# Patient Record
Sex: Male | Born: 1985 | Race: Black or African American | Hispanic: No | Marital: Single | Smoking: Never smoker
Health system: Southern US, Community
[De-identification: ages and names within clinical notes are randomized; demographics above are authoritative.]

## PROBLEM LIST (undated history)

## (undated) ENCOUNTER — Emergency Department (HOSPITAL_COMMUNITY): Payer: PRIVATE HEALTH INSURANCE

## (undated) DIAGNOSIS — R011 Cardiac murmur, unspecified: Secondary | ICD-10-CM

---

## 2008-04-12 ENCOUNTER — Emergency Department (HOSPITAL_COMMUNITY): Admission: EM | Admit: 2008-04-12 | Discharge: 2008-04-12 | Payer: Self-pay | Admitting: Emergency Medicine

## 2008-05-16 ENCOUNTER — Emergency Department (HOSPITAL_COMMUNITY): Admission: EM | Admit: 2008-05-16 | Discharge: 2008-05-16 | Payer: Self-pay | Admitting: Emergency Medicine

## 2008-05-16 ENCOUNTER — Other Ambulatory Visit: Payer: Self-pay

## 2009-01-17 ENCOUNTER — Emergency Department (HOSPITAL_COMMUNITY): Admission: EM | Admit: 2009-01-17 | Discharge: 2009-01-17 | Payer: Self-pay | Admitting: Emergency Medicine

## 2009-12-03 ENCOUNTER — Emergency Department (HOSPITAL_COMMUNITY): Admission: EM | Admit: 2009-12-03 | Discharge: 2009-12-03 | Payer: Self-pay | Admitting: Emergency Medicine

## 2011-05-20 LAB — COMPREHENSIVE METABOLIC PANEL
ALT: 27
AST: 24
Albumin: 3.8
Alkaline Phosphatase: 69
Chloride: 103
GFR calc Af Amer: >60
Potassium: 3.4 — ABNORMAL LOW
Sodium: 140
Total Bilirubin: 1.2

## 2011-05-20 LAB — POCT CARDIAC MARKERS: Troponin i, poc: <0.05

## 2011-05-20 LAB — DIFFERENTIAL
Basophils Absolute: 0
Basophils Relative: 1
Eosinophils Relative: 1
Lymphocytes Relative: 33
Monocytes Absolute: 0.6

## 2011-05-20 LAB — CBC
Platelets: 222
WBC: 5.9

## 2011-12-09 ENCOUNTER — Observation Stay (HOSPITAL_COMMUNITY)
Admission: EM | Admit: 2011-12-09 | Discharge: 2011-12-10 | Disposition: A | Discharge status: Home / Self Care | Payer: PRIVATE HEALTH INSURANCE | Source: Ambulatory Visit | Attending: Emergency Medicine | Admitting: Emergency Medicine

## 2011-12-09 ENCOUNTER — Encounter (HOSPITAL_COMMUNITY): Payer: Self-pay | Admitting: *Deleted

## 2011-12-09 ENCOUNTER — Other Ambulatory Visit: Payer: Self-pay

## 2011-12-09 ENCOUNTER — Emergency Department (HOSPITAL_COMMUNITY): Payer: PRIVATE HEALTH INSURANCE

## 2011-12-09 DIAGNOSIS — R079 Chest pain, unspecified: Principal | ICD-10-CM | POA: Insufficient documentation

## 2011-12-09 LAB — CBC
MCV: 80.3 fL (ref 78.0–100.0)
Platelets: 204 10*3/uL (ref 150–400)
RDW: 14.8 % (ref 11.5–15.5)
WBC: 5.3 10*3/uL (ref 4.0–10.5)

## 2011-12-09 LAB — CARDIAC PANEL(CRET KIN+CKTOT+MB+TROPI)
Relative Index: 0.8 (ref 0.0–2.5)
Total CK: 262 U/L — ABNORMAL HIGH (ref 7–232)
Troponin I: <0.3 ng/mL (ref <0.30)
Troponin I: <0.3 ng/mL (ref <0.30)

## 2011-12-09 LAB — BASIC METABOLIC PANEL
Chloride: 102 mEq/L (ref 96–112)
Creatinine, Ser: 0.84 mg/dL (ref 0.50–1.35)
GFR calc Af Amer: >90 mL/min (ref >90)
Sodium: 138 mEq/L (ref 135–145)

## 2011-12-09 LAB — TROPONIN I: Troponin I: <0.3 ng/mL (ref <0.30)

## 2011-12-09 MED ORDER — ASPIRIN 81 MG PO CHEW
324.0000 mg | CHEWABLE_TABLET | Freq: Once | ORAL | Status: AC
  Administered 2011-12-09: 324 mg via ORAL
  Filled 2011-12-09: qty 4

## 2011-12-09 NOTE — ED Notes (Signed)
To ED for eval of intermittent left arm and leg weakness noted today while in class. States he also felt nauseated and some cp. Only pain now is left arm.  Skin w/d, resp e/u.

## 2011-12-09 NOTE — ED Provider Notes (Addendum)
History     CSN: [id number]  Arrival date & time 12/09/11  1227   First MD Initiated Contact with Patient 12/09/11 1315      Chief Complaint  Patient presents with  . Chest Pain    (Consider location/radiation/quality/duration/timing/severity/associated sxs/prior treatment) Patient is a 26 y.o. male presenting with chest pain. The history is provided by the patient.  Chest Pain The chest pain began yesterday. Duration of episode(s) is 10 minutes. Chest pain occurs intermittently. The chest pain is unchanged. Associated with: sitting still. At its most intense, the pain is at 6/10. The pain is currently at 0/10. The severity of the pain is mild. The quality of the pain is described as sharp. The pain radiates to the left arm and left jaw. Primary symptoms include nausea. Pertinent negatives for primary symptoms include no fever, no fatigue, no syncope, no shortness of breath, no cough, no wheezing, no palpitations, no abdominal pain, no vomiting, no dizziness and no altered mental status.  Associated symptoms include near-syncope and weakness.  Pertinent negatives for associated symptoms include no claudication, no diaphoresis, no lower extremity edema, no numbness, no orthopnea and no paroxysmal nocturnal dyspnea. He tried nothing for the symptoms. Risk factors include obesity, male gender and sedentary lifestyle (fam hx of MI at 69, sudden death; UNcle adn grandmother died  at 54 yo from MI).  His family medical history is significant for CAD in family, diabetes in family, hyperlipidemia in family, hypertension in family, early MI in family, PE in family and sudden death in family.     History reviewed. No pertinent past medical history.  History reviewed. No pertinent past surgical history.  History reviewed. No pertinent family history.  History  Substance Use Topics  . Smoking status: Never Smoker   . Smokeless tobacco: Not on file  . Alcohol Use: No      Review of Systems    Constitutional: Negative for fever, diaphoresis and fatigue.  HENT: Negative for neck pain and neck stiffness.   Respiratory: Negative for cough, shortness of breath and wheezing.   Cardiovascular: Positive for chest pain and near-syncope. Negative for palpitations, orthopnea, claudication and syncope.  Gastrointestinal: Positive for nausea. Negative for vomiting and abdominal pain.  Neurological: Positive for weakness. Negative for dizziness and numbness.  Psychiatric/Behavioral: Negative for altered mental status.  All other systems reviewed and are negative.    Allergies  Review of patient's allergies indicates no known allergies.  Home Medications  No current outpatient prescriptions on file.  BP 149/81  Pulse 59  Temp(Src) 97.6 F (36.4 C) (Oral)  Resp 19  SpO2 100%  Physical Exam  Nursing note and vitals reviewed. Constitutional: He appears well-developed and well-nourished. No distress.  HENT:  Head: Normocephalic and atraumatic.  Eyes: Conjunctivae and EOM are normal. Pupils are equal, round, and reactive to light.  Neck: Normal range of motion. Neck supple. Normal carotid pulses and no JVD present. Carotid bruit is not present. No rigidity. Normal range of motion present.  Cardiovascular: Normal rate, regular rhythm, S1 normal, S2 normal, normal heart sounds, intact distal pulses and normal pulses.  Exam reveals no gallop and no friction rub.   No murmur heard.      No pitting edema bilaterally, RRR, no aberrant sounds on auscultations, distal pulses intact, no carotid bruit or JVD.   Pulmonary/Chest: Effort normal and breath sounds normal. No accessory muscle usage or stridor. No respiratory distress. He exhibits no tenderness and no bony tenderness.  Abdominal: Bowel  sounds are normal.       Soft non tender. Non pulsatile aorta.   Skin: Skin is warm, dry and intact. No rash noted. He is not diaphoretic. No cyanosis. Nails show no clubbing.    ED Course   Procedures (including critical care time)   Labs Reviewed  CBC  BASIC METABOLIC PANEL  TROPONIN I   Dg Chest 2 View  12/09/2011  *RADIOLOGY REPORT*  Clinical Data: Chest pain and left arm pain.  CHEST - 2 VIEW  Comparison: None.  Findings: Trachea is midline.  Heart size normal.  Lungs are clear. No pleural fluid.  IMPRESSION: No acute findings.  Original Report Authenticated By: Reyes Ivan, M.D.     No diagnosis found.    MDM  CP   BMI 42.  Patient history and plan has been discussed with the CDU midlevel provider who is agreeable with transfer. This patient has also been seen and evaluated by the attending who agrees that the patient will benefit from observation. Patient will be placed on chest pain protocol with re-evaluation pending. Patient stable prior to transfer, has no complaints, and is in NAD.  Plan is for patient to receive a cardiac stress test.  The patient is at low risk for ACS etiology of chest pain.  Patient will be placed on protocol due to significant family history of young cardiac death.  Assuming results are within normal limits I recommend patient followup with primary care provider for her high blood pressure recheck and medication control as well as establishing a relationship with a cardiologist.  Jaci Carrel, PA-C 12/10/11 1141  12:51 PM 12/10/2011 Results of stress echo:  Impressions: - Dyskinetic anteroseptal motion, may be due to electrical conduction delay, however, ischemic wall motion abnormality cannot be excluded. This study is adequate, however, endocardial definition is somewhat suboptimal due the patient's weight and body habitus. There is a marked hypertensive response and baseline hypertension which is notable. No diagnostic EKG changes for ischemia were noted. Clinical correlation is advised.  Pt will follow up with a PCP in regards to BP control and Dr. Rennis Golden for further evaluation & to establish relationship with cardiology.  Pt verbalizes understanding.  Patient be stable in no acute distress and asymptomatic prior to discharge and agreeable with plan    Jaci Carrel, PA-C 12/10/11 1254

## 2011-12-09 NOTE — ED Provider Notes (Signed)
Medical screening examination/treatment/procedure(s) were performed by non-physician practitioner and as supervising physician I was immediately available for consultation/collaboration. Patient placed in CDU under chest pain protocol, largely due to his notable family history of disease.  Gerhard Munch, MD 12/09/11 [phone]

## 2011-12-09 NOTE — ED Notes (Signed)
Patient states he has been having pains (10 sec) in his chest and a lot of pain in his left arm this morning. States onset while in class. States he did feel dizzy at one point. States has family hx of cad and people dying at young ages due to heart attacks and hypertension. Stats he is nervous and concerned. Pt education provided on morning stress test.

## 2011-12-09 NOTE — ED Notes (Signed)
Meal ordered

## 2011-12-09 NOTE — ED Provider Notes (Signed)
Patient moved to CDU under chest pain protocol.  Patient resting comfortably at present without return of chest pain.  Lungs CTA bilaterally.  S1/S2, RRR, no murmur.  Abdomen soft, bowel sounds present.  Strong distal pulses palpated all extremities.  Sinus rhythm on monitor without ectopy.  Troponin negative x 2.  Elevated total CK (262), but normal CK-MB (2.0).  12 lead reviewed, no indication of ischemia.  Patient scheduled for stress echo in AM.  Diagnostic and treatment plan discussed with patient.  Jimmye Norman, NP 12/09/11 1842

## 2011-12-09 NOTE — ED Notes (Signed)
CDU full at this time, will move pt when room is available.

## 2011-12-10 DIAGNOSIS — R079 Chest pain, unspecified: Secondary | ICD-10-CM

## 2011-12-10 NOTE — ED Provider Notes (Signed)
Medical screening examination/treatment/procedure(s) were performed by non-physician practitioner and as supervising physician I was immediately available for consultation/collaboration. This generally well young M w notable family RF for CAD now p/w CP.  Given his risk profile he had Stress ECHO, then was d/c w PMD F/U.  Case well described by PA Paz.  Gerhard Munch, MD 12/10/11 [phone]

## 2011-12-10 NOTE — ED Provider Notes (Signed)
Morning EKG   05:37  Date: 12/10/2011  Rate: 46  Rhythm: sinus bradycardia  QRS Axis: normal  Intervals: normal  ST/T Wave abnormalities: normal  Conduction Disutrbances:none  Narrative Interpretation:   Old EKG Reviewed: none available  Pt waiting to get stress echocardiogram this morning. Has family hx of sudden death in 27 yo cousin.   Devoria Albe, MD, FACEP     Ward Givens, MD 12/10/11 [phone]

## 2011-12-10 NOTE — ED Provider Notes (Signed)
Medical screening examination/treatment/procedure(s) were performed by non-physician practitioner and as supervising physician I was immediately available for consultation/collaboration.  Raeford Razor, MD 12/10/11 [phone]

## 2011-12-10 NOTE — ED Notes (Signed)
Called echo lab to check on delay in getting pt results.

## 2011-12-10 NOTE — Discharge Instructions (Signed)
Read instructions below for reasons to return to the Emergency Department. It is recommended that your follow up with your Primary Care Doctor in regards to today's visit concerning your blood pressure.  It is also recommended he establish a relationship with the cardiologists listed in your paperwork Dr. Rennis Golden.   [place] Pain (Nonspecific)  HOME CARE INSTRUCTIONS  For the next few days, avoid physical activities that bring on chest pain. Continue physical activities as directed.  Do not smoke cigarettes or drink alcohol until your symptoms are gone.  Only take over-the-counter or prescription medicine for pain, discomfort, or fever as directed by your caregiver.  Follow your caregiver's suggestions for further testing if your chest pain does not go away.  Keep any follow-up appointments you made. If you do not go to an appointment, you could develop lasting (chronic) problems with pain. If there is any problem keeping an appointment, you must call to reschedule.  SEEK MEDICAL CARE IF:  You think you are having problems from the medicine you are taking. Read your medicine instructions carefully.  Your chest pain does not go away, even after treatment.  You develop a rash with blisters on your chest.  SEEK IMMEDIATE MEDICAL CARE IF:  You have increased chest pain or pain that spreads to your arm, neck, jaw, back, or belly (abdomen).  You develop shortness of breath, an increasing cough, or you are coughing up blood.  You have severe back or abdominal pain, feel sick to your stomach (nauseous) or throw up (vomit).  You develop severe weakness, fainting, or chills.  You have an oral temperature above 102 F (38.9 C), not controlled by medicine.   THIS IS AN EMERGENCY. Do not wait to see if the pain will go away. Get medical help at once. Call your local emergency services (911 in [place] Do not drive yourself to the hospital.   RESOURCE GUIDE  Dental Problems  Patients with  Medicaid: [facility] [phone] [address]                                           [phone] [address] Phone:  [phone]                                                  Phone:  [phone]  If unable to pay or uninsured, contact:  [facility] or [facility] to become qualified for the adult dental clinic.  Chronic Pain Problems Contact [facility]  [phone] Patients need to be referred by their primary care doctor.  Insufficient Money for Medicine Contact United Way:  call "211" or [facility] [phone].  No Primary Care Doctor Call [facility]  [phone] Other agencies that provide inexpensive medical care    [facility]  [phone]    [facility]  [phone]    [facility]  [phone]    [facility]  [phone]    [facility]  [phone]    [facility]  [phone]  Psychological Services [facility] Behavioral Health  [phone] [facility]  Services  [phone] [facility] Mental Health   [phone] (emergency services [phone])  Substance Abuse Resources Alcohol and Drug Services  505-186-1315 [facility] [phone] [facility] [place] [phone] Floydene Flock [phone] Residential & Outpatient Substance Abuse Program  [phone]  Abuse/Neglect [facility] Child Abuse Hotline 514-113-6677 [facility] Child Abuse Hotline [phone] (After Hours)  Emergency Shelter [facility] Minist[facility] [phone]  Maternity Homes Room at the [place] [facility] [place] [phone] [facility] [phone]  MRSA Hotline #:   408-292-6617    [facility] Resources  [facility] [place]     [employer]                          [facility] Dept. [address] [place]                       [address]      [address]  Blondell Reveal Phone:  [phone]                                   Phone:  [phone]                 Phone:  [phone]  [facility] Mental Health Phone:  [phone]  [facility] Child Abuse Hotline [phone]) [phone] (After Hours)

## 2011-12-13 NOTE — Progress Notes (Signed)
Post discharge review done per insurance request. Genella Rife [facility] 12/13/2011

## 2013-08-05 ENCOUNTER — Ambulatory Visit: Payer: PRIVATE HEALTH INSURANCE

## 2014-07-06 ENCOUNTER — Emergency Department (HOSPITAL_COMMUNITY): Payer: PRIVATE HEALTH INSURANCE

## 2014-07-06 ENCOUNTER — Encounter (HOSPITAL_COMMUNITY): Payer: Self-pay | Admitting: Emergency Medicine

## 2014-07-06 ENCOUNTER — Emergency Department (HOSPITAL_COMMUNITY)
Admission: EM | Admit: 2014-07-06 | Discharge: 2014-07-06 | Disposition: A | Discharge status: Home / Self Care | Payer: PRIVATE HEALTH INSURANCE | Attending: Emergency Medicine | Admitting: Emergency Medicine

## 2014-07-06 DIAGNOSIS — R091 Pleurisy: Secondary | ICD-10-CM | POA: Insufficient documentation

## 2014-07-06 DIAGNOSIS — R079 Chest pain, unspecified: Secondary | ICD-10-CM

## 2014-07-06 DIAGNOSIS — R011 Cardiac murmur, unspecified: Secondary | ICD-10-CM | POA: Insufficient documentation

## 2014-07-06 HISTORY — DX: Cardiac murmur, unspecified: R01.1

## 2014-07-06 LAB — CBC WITH DIFFERENTIAL/PLATELET
BASOS ABS: 0 10*3/uL (ref 0.0–0.1)
BASOS PCT: 1 % (ref 0–1)
EOS ABS: 0 10*3/uL (ref 0.0–0.7)
Eosinophils Relative: 1 % (ref 0–5)
HEMATOCRIT: 45.2 % (ref 39.0–52.0)
HEMOGLOBIN: 14.6 g/dL (ref 13.0–17.0)
Lymphocytes Relative: 43 % (ref 12–46)
Lymphs Abs: 2.6 10*3/uL (ref 0.7–4.0)
MCH: 26 pg (ref 26.0–34.0)
MCHC: 32.3 g/dL (ref 30.0–36.0)
MCV: 80.6 fL (ref 78.0–100.0)
MONO ABS: 0.5 10*3/uL (ref 0.1–1.0)
MONOS PCT: 8 % (ref 3–12)
Neutro Abs: 2.9 10*3/uL (ref 1.7–7.7)
Neutrophils Relative %: 47 % (ref 43–77)
Platelets: 226 10*3/uL (ref 150–400)
RBC: 5.61 MIL/uL (ref 4.22–5.81)
RDW: 15.5 % (ref 11.5–15.5)
WBC: 6 10*3/uL (ref 4.0–10.5)

## 2014-07-06 LAB — BASIC METABOLIC PANEL
Anion gap: 9 (ref 5–15)
BUN: 12 mg/dL (ref 6–23)
CALCIUM: 9.3 mg/dL (ref 8.4–10.5)
CO2: 30 mEq/L (ref 19–32)
Chloride: 102 mEq/L (ref 96–112)
Creatinine, Ser: 0.88 mg/dL (ref 0.50–1.35)
GLUCOSE: 103 mg/dL — AB (ref 70–99)
POTASSIUM: 4.2 meq/L (ref 3.7–5.3)
SODIUM: 141 meq/L (ref 137–147)

## 2014-07-06 LAB — I-STAT TROPONIN, ED: TROPONIN I, POC: 0 ng/mL (ref 0.00–0.08)

## 2014-07-06 MED ORDER — NAPROXEN 500 MG PO TABS: 500.0000 mg | ORAL_TABLET | Freq: Two times a day (BID) | ORAL | Status: AC

## 2014-07-06 NOTE — ED Notes (Signed)
Pt c/o left sided CP x 3 months worse over last couple of days with some SOB; pain is intermittent

## 2014-07-06 NOTE — ED Provider Notes (Signed)
CSN: [id number]     Arrival date & time 07/06/14  1203 History   First MD Initiated Contact with Patient 07/06/14 1315     Chief Complaint  Patient presents with  . Chest Pain      HPI  Vision presents for evaluation of chest pain. He describes sharp intermittent left lower chest pain. Multiple episodes of last few years. States "every few months". Describes sudden onset sharp well localized pleuritic pain without associated cough sputum hemoptysis or exertional symptoms. Does not radiate. Well localized. No additional associated symptoms.  Past Medical History  Diagnosis Date  . Heart murmur    History reviewed. No pertinent past surgical history. History reviewed. No pertinent family history. History  Substance Use Topics  . Smoking status: Never Smoker   . Smokeless tobacco: Not on file  . Alcohol Use: Yes     Comment: occ    Review of Systems  Constitutional: Negative for fever, chills, diaphoresis, appetite change and fatigue.  HENT: Negative for mouth sores, sore throat and trouble swallowing.   Eyes: Negative for visual disturbance.  Respiratory: Negative for cough, chest tightness, shortness of breath and wheezing.   Cardiovascular: Positive for chest pain.  Gastrointestinal: Negative for nausea, vomiting, abdominal pain, diarrhea and abdominal distention.  Endocrine: Negative for polydipsia, polyphagia and polyuria.  Genitourinary: Negative for dysuria, frequency and hematuria.  Musculoskeletal: Negative for gait problem.  Skin: Negative for color change, pallor and rash.  Neurological: Negative for dizziness, syncope, light-headedness and headaches.  Hematological: Does not bruise/bleed easily.  Psychiatric/Behavioral: Negative for behavioral problems and confusion.      Allergies  Review of patient's allergies indicates no known allergies.  Home Medications   Prior to Admission medications   Medication Sig Start Date End Date Taking? Authorizing Provider   naproxen (NAPROSYN) 500 MG tablet Take 1 tablet (500 mg total) by mouth 2 (two) times daily. 07/06/14   Rolland PorterMark Maat Kafer, MD   BP 118/72 mmHg  Pulse 61  Temp(Src) 97.8 F (36.6 C) (Oral)  Resp 16  Ht 5\' 8"  (1.727 m)  Wt 290 lb (131.543 kg)  BMI 44.10 kg/m2  SpO2 100% Physical Exam  Constitutional: He is oriented to person, place, and time. He appears well-developed and well-nourished. No distress.  HENT:  Head: Normocephalic.  Eyes: Conjunctivae are normal. Pupils are equal, round, and reactive to light. No scleral icterus.  Neck: Normal range of motion. Neck supple. No thyromegaly present.  Cardiovascular: Normal rate and regular rhythm.  Exam reveals no gallop and no friction rub.   No murmur heard. Pulmonary/Chest: Effort normal and breath sounds normal. No respiratory distress. He has no wheezes. He has no rales.    Abdominal: Soft. Bowel sounds are normal. He exhibits no distension. There is no tenderness. There is no rebound.  Musculoskeletal: Normal range of motion.  Neurological: He is alert and oriented to person, place, and time.  Skin: Skin is warm and dry. No rash noted.  Psychiatric: He has a normal mood and affect. His behavior is normal.    ED Course  Procedures (including critical care time) Labs Review Labs Reviewed  BASIC METABOLIC PANEL - Abnormal; Notable for the following:    Glucose, Bld 103 (*)    All other components within normal limits  CBC WITH DIFFERENTIAL  I-STAT TROPOININ, ED    Imaging Review Dg Chest 2 View  07/06/2014   CLINICAL DATA:  Left-sided chest pain for 3 months initial encounter, intermittent, worse over the last couple  days with some shortness of breath  EXAM: CHEST  2 VIEW  COMPARISON:  None.  FINDINGS: The heart size and mediastinal contours are within normal limits. Both lungs are clear. The visualized skeletal structures are unremarkable.  IMPRESSION: No active cardiopulmonary disease.   Electronically Signed   By: Esperanza Heiraymond  Rubner  M.D.   On: 07/06/2014 13:13     EKG Interpretation None      MDM   Final diagnoses:  Pleurisy    EKG normal. Sinus arrhythmia noted. Chest x-ray without infiltrates or effusions. Reassuring labs. Young low risk patient. Classic symptoms for pleurisy with recurrent sharp localized pain. Plan is discharge home. Anti-inflammatories when necessary.    Rolland PorterMark Maurilio Puryear, MD 07/06/14 [phone]

## 2014-07-06 NOTE — ED Notes (Signed)
Dr. James at bedside  

## 2014-07-06 NOTE — Discharge Instructions (Signed)
Pleurisy °Pleurisy is redness, puffiness (swelling), and soreness (inflammation) of the lining of the lungs. It can be hard to breathe and hurt to breathe. Coughing or deep breathing will make it hurt more. It is often caused by an existing infection or disease.  °HOME CARE °· Only take medicine as told by your doctor. °· Only take antibiotic medicine as directed. Make sure to finish it even if you start to feel better. °GET HELP RIGHT AWAY IF:  °· Your lips, fingernails, or toenails are blue or dark. °· You cough up blood. °· You have a hard time breathing. °· Your pain is not controlled with medicine or it lasts for more than 1 week. °· Your pain spreads (radiates) into your neck, arms, or jaw. °· You are short of breath or wheezing. °· You develop a fever, rash, throw up (vomit), or faint. °MAKE SURE YOU:  °· Understand these instructions. °· Will watch your condition. °· Will get help right away if you are not doing well or get worse. °Document Released: 07/18/2008 Document Revised: 04/07/2013 Document Reviewed: 01/17/2013 °ExitCare® Patient Information ©2015 [employer]. This information is not intended to replace advice given to you by your health care provider. Make sure you discuss any questions you have with your health care provider. ° °

## 2015-02-17 ENCOUNTER — Emergency Department (HOSPITAL_COMMUNITY): Payer: PRIVATE HEALTH INSURANCE

## 2015-02-17 ENCOUNTER — Emergency Department (HOSPITAL_COMMUNITY)
Admission: EM | Admit: 2015-02-17 | Discharge: 2015-02-17 | Disposition: A | Discharge status: Home / Self Care | Payer: PRIVATE HEALTH INSURANCE | Attending: Emergency Medicine | Admitting: Emergency Medicine

## 2015-02-17 ENCOUNTER — Encounter (HOSPITAL_COMMUNITY): Payer: Self-pay | Admitting: Neurology

## 2015-02-17 DIAGNOSIS — R55 Syncope and collapse: Secondary | ICD-10-CM | POA: Insufficient documentation

## 2015-02-17 DIAGNOSIS — Z791 Long term (current) use of non-steroidal anti-inflammatories (NSAID): Secondary | ICD-10-CM | POA: Diagnosis not present

## 2015-02-17 DIAGNOSIS — R011 Cardiac murmur, unspecified: Secondary | ICD-10-CM | POA: Diagnosis not present

## 2015-02-17 DIAGNOSIS — R42 Dizziness and giddiness: Secondary | ICD-10-CM | POA: Insufficient documentation

## 2015-02-17 LAB — URINALYSIS, ROUTINE W REFLEX MICROSCOPIC
Bilirubin Urine: NEGATIVE
GLUCOSE, UA: NEGATIVE mg/dL
KETONES UR: NEGATIVE mg/dL
Leukocytes, UA: NEGATIVE
NITRITE: NEGATIVE
PH: 6.5 (ref 5.0–8.0)
PROTEIN: NEGATIVE mg/dL
SPECIFIC GRAVITY, URINE: 1.024 (ref 1.005–1.030)
Urobilinogen, UA: 1 mg/dL (ref 0.0–1.0)

## 2015-02-17 LAB — CBC
HCT: 42.6 % (ref 39.0–52.0)
HEMOGLOBIN: 14.1 g/dL (ref 13.0–17.0)
MCH: 26.6 pg (ref 26.0–34.0)
MCHC: 33.1 g/dL (ref 30.0–36.0)
MCV: 80.2 fL (ref 78.0–100.0)
PLATELETS: 211 10*3/uL (ref 150–400)
RBC: 5.31 MIL/uL (ref 4.22–5.81)
RDW: 15.6 % — ABNORMAL HIGH (ref 11.5–15.5)
WBC: 6.6 10*3/uL (ref 4.0–10.5)

## 2015-02-17 LAB — I-STAT CHEM 8, ED
BUN: 13 mg/dL (ref 6–20)
CALCIUM ION: 1.12 mmol/L (ref 1.12–1.23)
Chloride: 103 mmol/L (ref 101–111)
Creatinine, Ser: 0.9 mg/dL (ref 0.61–1.24)
Glucose, Bld: 124 mg/dL — ABNORMAL HIGH (ref 65–99)
HEMATOCRIT: 47 % (ref 39.0–52.0)
Hemoglobin: 16 g/dL (ref 13.0–17.0)
Potassium: 3.5 mmol/L (ref 3.5–5.1)
Sodium: 141 mmol/L (ref 135–145)
TCO2: 24 mmol/L (ref 0–100)

## 2015-02-17 LAB — BASIC METABOLIC PANEL
Anion gap: 11 (ref 5–15)
BUN: 12 mg/dL (ref 6–20)
CO2: 23 mmol/L (ref 22–32)
Calcium: 8.9 mg/dL (ref 8.9–10.3)
Chloride: 105 mmol/L (ref 101–111)
Creatinine, Ser: 1 mg/dL (ref 0.61–1.24)
GLUCOSE: 125 mg/dL — AB (ref 65–99)
Potassium: 3.5 mmol/L (ref 3.5–5.1)
Sodium: 139 mmol/L (ref 135–145)

## 2015-02-17 LAB — URINE MICROSCOPIC-ADD ON

## 2015-02-17 LAB — I-STAT TROPONIN, ED: TROPONIN I, POC: 0.01 ng/mL (ref 0.00–0.08)

## 2015-02-17 LAB — CK: CK TOTAL: 521 U/L — AB (ref 49–397)

## 2015-02-17 MED ORDER — SODIUM CHLORIDE 0.9 % IV BOLUS (SEPSIS)
1000.0000 mL | Freq: Once | INTRAVENOUS | Status: AC
  Administered 2015-02-17: 1000 mL via INTRAVENOUS

## 2015-02-17 NOTE — Discharge Instructions (Signed)
Syncope °Syncope is a medical term for fainting or passing out. This means you lose consciousness and drop to the ground. People are generally unconscious for less than 5 minutes. You may have some muscle twitches for up to 15 seconds before waking up and returning to normal. Syncope occurs more often in older adults, but it can happen to anyone. While most causes of syncope are not dangerous, syncope can be a sign of a serious medical problem. It is important to seek medical care.  °CAUSES  °Syncope is caused by a sudden drop in blood flow to the brain. The specific cause is often not determined. Factors that can bring on syncope include: °· Taking medicines that lower blood pressure. °· Sudden changes in posture, such as standing up quickly. °· Taking more medicine than prescribed. °· Standing in one place for too long. °· Seizure disorders. °· Dehydration and excessive exposure to heat. °· Low blood sugar (hypoglycemia). °· Straining to have a bowel movement. °· Heart disease, irregular heartbeat, or other circulatory problems. °· Fear, emotional distress, seeing blood, or severe pain. °SYMPTOMS  °Right before fainting, you may: °· Feel dizzy or light-headed. °· Feel nauseous. °· See all white or all black in your field of vision. °· Have cold, clammy skin. °DIAGNOSIS  °Your health care provider will ask about your symptoms, perform a physical exam, and perform an electrocardiogram (ECG) to record the electrical activity of your heart. Your health care provider may also perform other heart or blood tests to determine the cause of your syncope which may include: °· Transthoracic echocardiogram (TTE). During echocardiography, sound waves are used to evaluate how blood flows through your heart. °· Transesophageal echocardiogram (TEE). °· Cardiac monitoring. This allows your health care provider to monitor your heart rate and rhythm in real time. °· Holter monitor. This is a portable device that records your  heartbeat and can help diagnose heart arrhythmias. It allows your health care provider to track your heart activity for several days, if needed. °· Stress tests by exercise or by giving medicine that makes the heart beat faster. °TREATMENT  °In most cases, no treatment is needed. Depending on the cause of your syncope, your health care provider may recommend changing or stopping some of your medicines. °HOME CARE INSTRUCTIONS °· Have someone stay with you until you feel stable. °· Do not drive, use machinery, or play sports until your health care provider says it is okay. °· Keep all follow-up appointments as directed by your health care provider. °· Lie down right away if you start feeling like you might faint. Breathe deeply and steadily. Wait until all the symptoms have passed. °· Drink enough fluids to keep your urine clear or pale yellow. °· If you are taking blood pressure or heart medicine, get up slowly and take several minutes to sit and then stand. This can reduce dizziness. °SEEK IMMEDIATE MEDICAL CARE IF:  °· You have a severe headache. °· You have unusual pain in the chest, abdomen, or back. °· You are bleeding from your mouth or rectum, or you have black or tarry stool. °· You have an irregular or very fast heartbeat. °· You have pain with breathing. °· You have repeated fainting or seizure-like jerking during an episode. °· You faint when sitting or lying down. °· You have confusion. °· You have trouble walking. °· You have severe weakness. °· You have vision problems. °If you fainted, call your local emergency services (911 in [place] Do not drive   yourself to the hospital.  °MAKE SURE YOU: °· Understand these instructions. °· Will watch your condition. °· Will get help right away if you are not doing well or get worse. °Document Released: 08/05/2005 Document Revised: 08/10/2013 Document Reviewed: 10/04/2011 °ExitCare® Patient Information ©2015 [employer]. This information is not intended to replace  advice given to you by your health care provider. Make sure you discuss any questions you have with your health care provider. ° °

## 2015-02-17 NOTE — ED Notes (Signed)
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 Remembers feeling nauseated, dizzy before passing out. Says warehouse is hot but had fan blowing on him. Denies pain, is a x 4.

## 2015-02-17 NOTE — ED Notes (Signed)
Pt yellow necklace placed in a specimen cup and placed with pt belongings.

## 2015-02-17 NOTE — ED Provider Notes (Signed)
CSN: [id number]     Arrival date & time 02/17/15  1449 History   First MD Initiated Contact with Patient 02/17/15 1516     Chief Complaint  Patient presents with  . Loss of Consciousness   HPI  Pt is a previously healthy 29 yo male presenting after three syncopal events today.  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  No seizure activity at the scene per patient. Denies hitting head, pain in extremities, palpitations, CP, or SOB prior to event.  Had two additional events while standing in wear house with similar prodrome and brought to ED for evaluation.  Mother checked his BG prior to arrival and in 120s.   Past Medical History  Diagnosis Date  . Heart murmur    History reviewed. No pertinent past surgical history. No family history on file. History  Substance Use Topics  . Smoking status: Never Smoker   . Smokeless tobacco: Not on file  . Alcohol Use: Yes     Comment: occ    Review of Systems  Constitutional: Negative for fever and chills.  HENT: Negative for congestion and sore throat.   Eyes: Negative for pain and visual disturbance.  Respiratory: Negative for cough and shortness of breath.   Cardiovascular: Negative for chest pain, palpitations and leg swelling.  Gastrointestinal: Negative for nausea, vomiting, abdominal pain and diarrhea.  Endocrine: Negative.   Genitourinary: Negative for flank pain.  Musculoskeletal: Negative for back pain and neck pain.  Skin: Negative for rash.  Allergic/Immunologic: Negative.   Neurological: Positive for dizziness, syncope and light-headedness. Negative for tremors, seizures, facial asymmetry, weakness, numbness and headaches.  Psychiatric/Behavioral: Negative for confusion.      Allergies  Review of patient's allergies indicates no known allergies.  Home Medications   Prior to Admission medications   Medication Sig Start Date  End Date Taking? Authorizing Provider  naproxen (NAPROSYN) 500 MG tablet Take 1 tablet (500 mg total) by mouth 2 (two) times daily. Patient not taking: Reported on 02/17/2015 07/06/14   Rolland Porter, MD   BP 145/77 mmHg  Pulse 60  Temp(Src) 97.8 F (36.6 C) (Oral)  Resp 16  Ht 5\' 8"  (1.727 m)  Wt 298 lb (135.172 kg)  BMI 45.32 kg/m2  SpO2 96% Physical Exam  Constitutional: He is oriented to person, place, and time. He appears well-developed and well-nourished.  HENT:  Head: Normocephalic and atraumatic.  Eyes: Conjunctivae and EOM are normal. Pupils are equal, round, and reactive to light.  Neck: Normal range of motion. Neck supple.  Cardiovascular: Normal rate, regular rhythm, normal heart sounds and intact distal pulses.   No extrasystoles are present.  Pulses:      Radial pulses are 2+ on the right side, and 2+ on the left side.       Dorsalis pedis pulses are 2+ on the right side, and 2+ on the left side.  Pulmonary/Chest: Effort normal and breath sounds normal. No respiratory distress.  Abdominal: Soft. Bowel sounds are normal. There is no tenderness.  Musculoskeletal: Normal range of motion.  Neurological: He is alert and oriented to person, place, and time. He has normal strength and normal reflexes. He displays normal reflexes. No cranial nerve deficit or sensory deficit. He displays a negative Romberg sign. GCS eye subscore is 4. GCS verbal subscore is 5. GCS motor subscore is 6.  Normal finger to nose bilaterally.  Rapid alternating movements intact bilaterally.  Normal heal to shin bilaterally.   No pronator drift bilaterally.    Skin: Skin is warm and dry.    ED Course  Procedures (including critical care time) Labs Review Labs Reviewed  CBC - Abnormal; Notable for the following:    RDW 15.6 (*)    All other components within normal limits  BASIC METABOLIC PANEL - Abnormal; Notable for the following:    Glucose, Bld 125 (*)    All other components within normal  limits  URINALYSIS, ROUTINE W REFLEX MICROSCOPIC (NOT AT [facility]) - Abnormal; Notable for the following:    APPearance HAZY (*)    Hgb urine dipstick TRACE (*)    All other components within normal limits  CK - Abnormal; Notable for the following:    Total CK 521 (*)    All other components within normal limits  I-STAT CHEM 8, ED - Abnormal; Notable for the following:    Glucose, Bld 124 (*)    All other components within normal limits  URINE MICROSCOPIC-ADD ON  Rosezena SensorI-STAT TROPOININ, ED    Imaging Review Dg Chest 2 View  02/17/2015   CLINICAL DATA:  Syncope.  EXAM: CHEST  2 VIEW  COMPARISON:  July 06, 2014.  FINDINGS: The heart size and mediastinal contours are within normal limits. Both lungs are clear. No pneumothorax or pleural effusion is noted. The visualized skeletal structures are unremarkable.  IMPRESSION: No active cardiopulmonary disease.   Electronically Signed   By: Lupita RaiderJames  Green Jr, M.D.   On: 02/17/2015 16:12     EKG Interpretation   Date/Time:  Friday February 17 2015 14:54:18 EDT Ventricular Rate:  70 PR Interval:  152 QRS Duration: 82 QT Interval:  388 QTC Calculation: 419 R Axis:   82 Text Interpretation:  Normal sinus rhythm Normal ECG ED PHYSICIAN  INTERPRETATION AVAILABLE IN [facility] Confirmed by TEST, Record  ([id number]) on 02/18/2015 9:56:58 AM      MDM   Final diagnoses:  Syncope and collapse    Pt is a 29 yo male working in hot wear house today when had three episodes of syncope with prodrome.  No CP, palpitations, SOB, or illness in last week.  On prior similar episode and recalls wearing a monitor in high school.  No seizure hx or reports of seizure activity on scene.  No family hx of cardiac arrhythmia, unexplained deaths or death at young age.   Ddx arrhythmoginic syncope, orthostatic syncope, seizure, stroke, heat stroke  On arrival pt normothermic and with normal mental status. EKG NSR with no acute ischemic changes, arrhythmia, or arrhythmogenic  potential. CXR with no enlarged heart or acute cardiopulmonary abnormalities.  No seizure activity at scene with no urinating self or lacerations in mouth.  Was standing and doubt orthostatic.  Pt with glucose in 120s.  CK mildly elevated. No acute electrolyte derangements on BMP.  UA with no ketones or protein.  No anemia or leukocytosis.  Doubt primary cardiac etiology of symptoms.   Pt given fluids and monitored in the ED for 2 hours with no similar events.  Given f/u for cardiology and advised would need to call and arrange Holter monitor.    If performed, labs, EKGs, and imaging were reviewed/interpreted by myself and my attending and incorporated into medical decision making.  Discussed pertinent finding with patient or caregiver prior to discharge with no further questions.  Immediate return precautions given and pt or caregiver reports understanding.  Pt care supervised  by my attending Dr. Trecia Rogers, MD PGY-2  Emergency Medicine     Tery Sanfilippo, MD 02/18/15 1114  Gerhard Munch, MD 02/21/15 [phone]

## 2015-02-17 NOTE — ED Notes (Signed)
Pt reports he became hot and dizzy today while working. Reports x3 syncopal episodes. Denies chest pain. Respirations unlabored. Pt is alert and oriented x4.

## 2015-10-05 IMAGING — DX DG CHEST 2V
2 series · 2 of 2 positions shown · non-contrast
Comparison: None.

CLINICAL DATA: Left-sided chest pain for 3 months initial
encounter, intermittent, worse over the last couple days with some
shortness of breath

EXAM:
CHEST  2 VIEW

[chest pa]
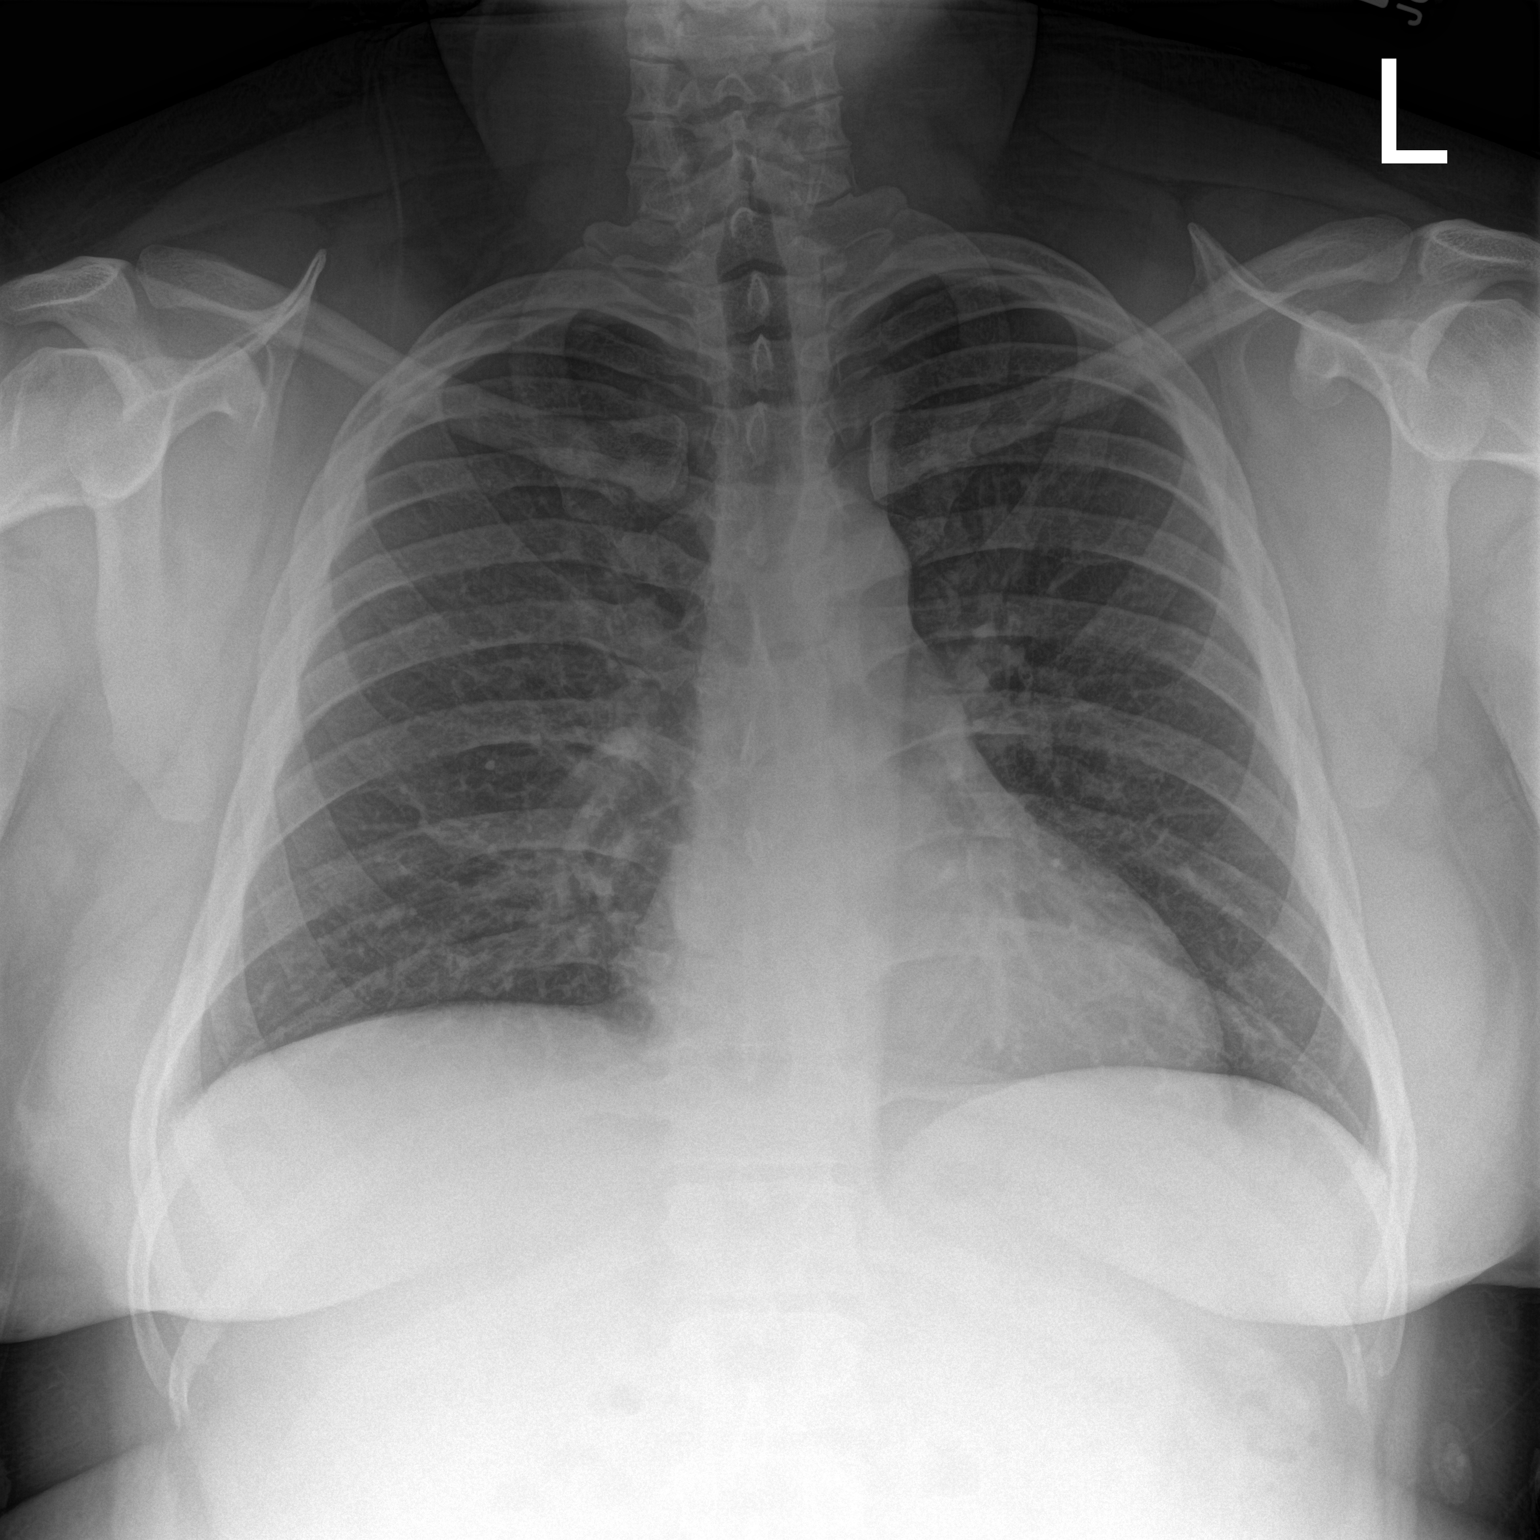

[chest lat]
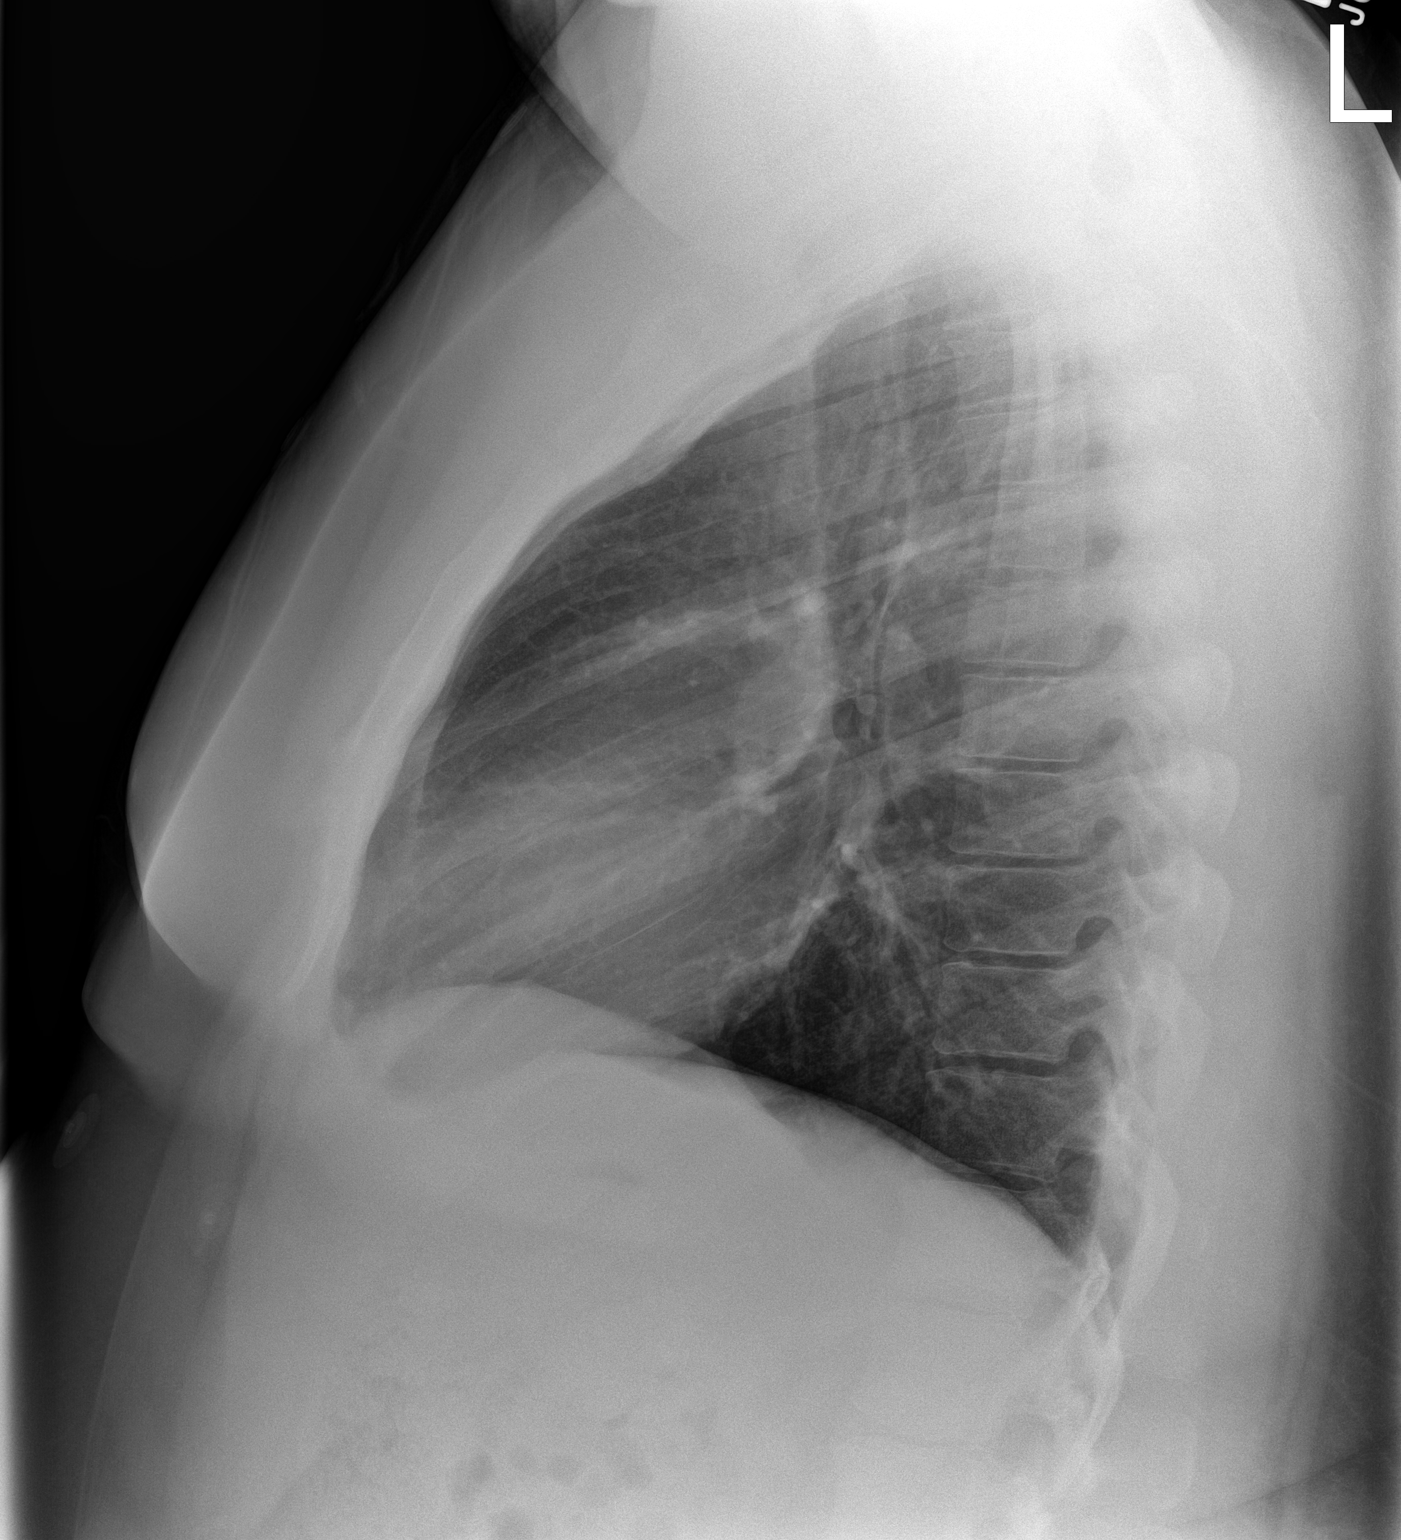

[2 of 2 positions shown; findings below may reference images not displayed]

FINDINGS: The heart size and mediastinal contours are within normal limits.
Both lungs are clear. The visualized skeletal structures are
unremarkable.
IMPRESSION: No active cardiopulmonary disease.

## 2016-05-18 IMAGING — DX DG CHEST 2V
2 series · 2 of 2 positions shown · non-contrast
Comparison: July 06, 2014.

CLINICAL DATA: Syncope.

EXAM:
CHEST  2 VIEW

[chest lat]
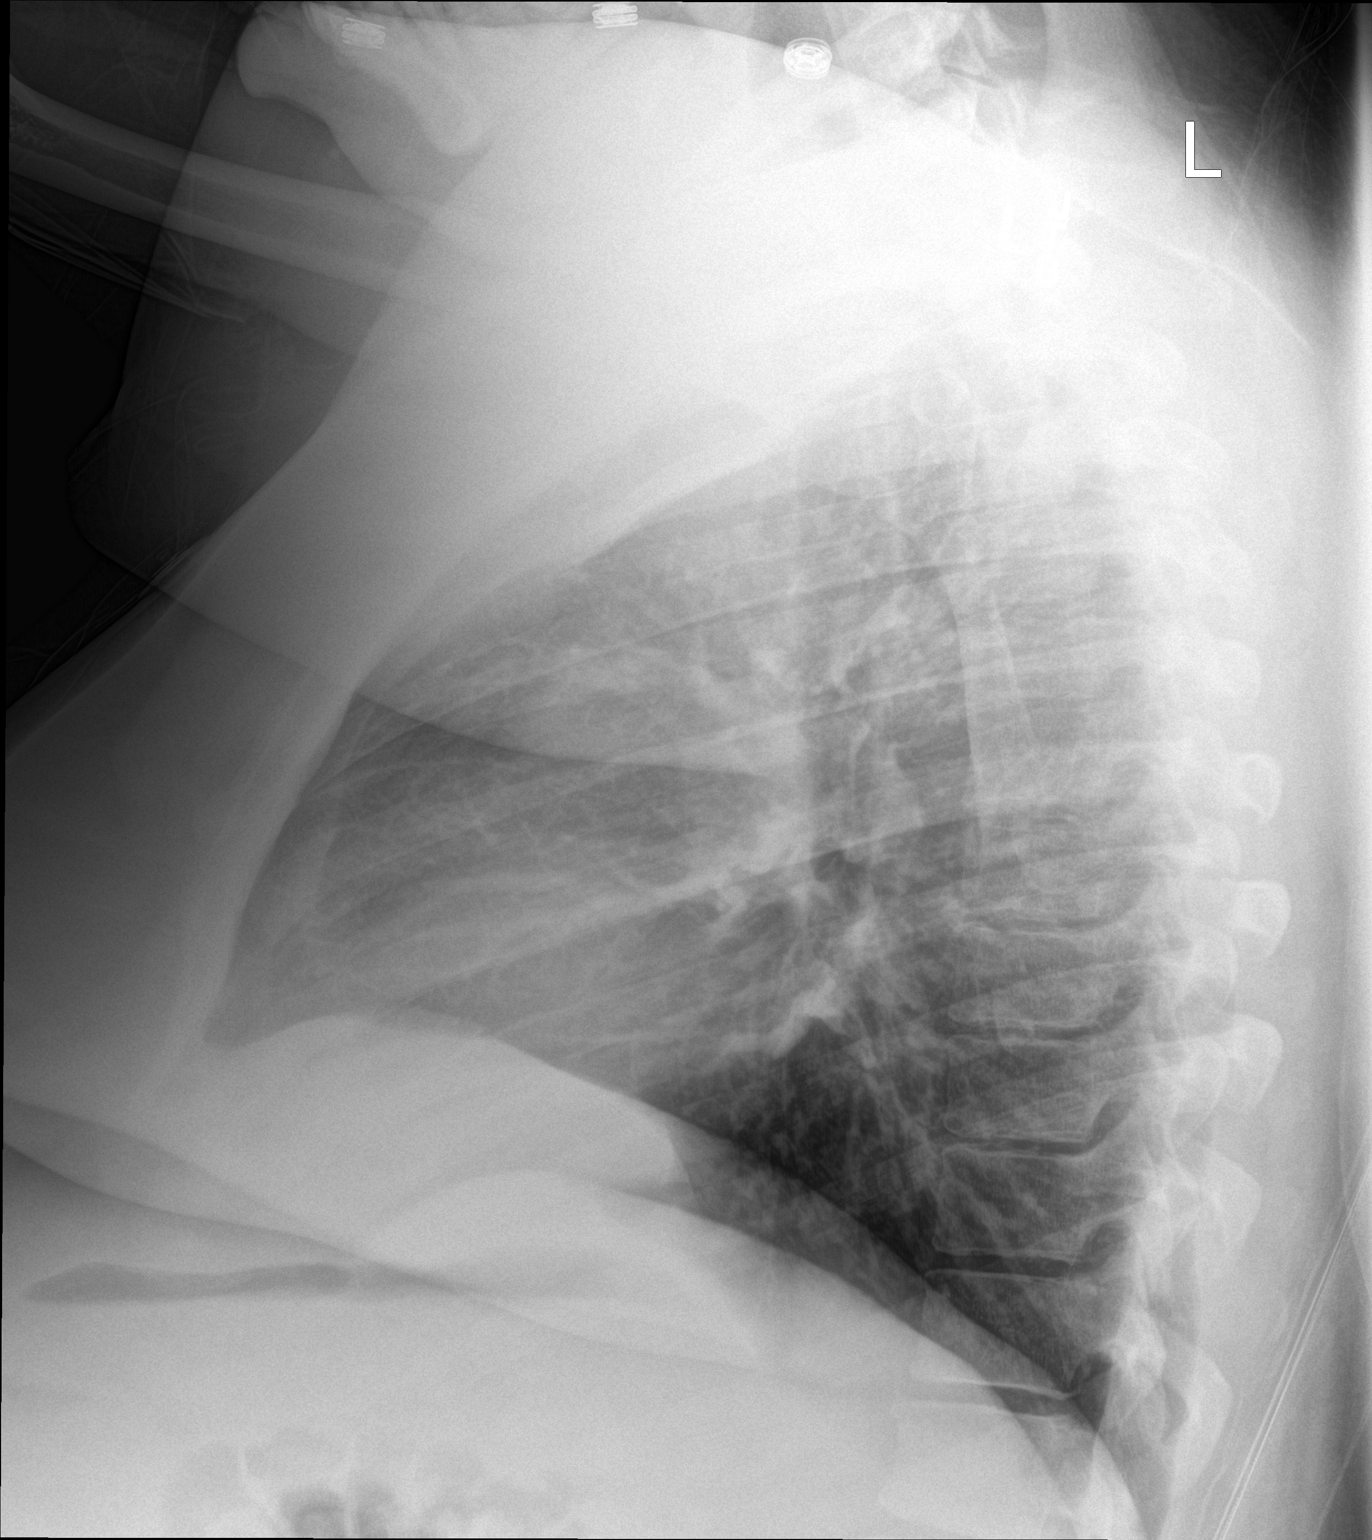

[chest ap]
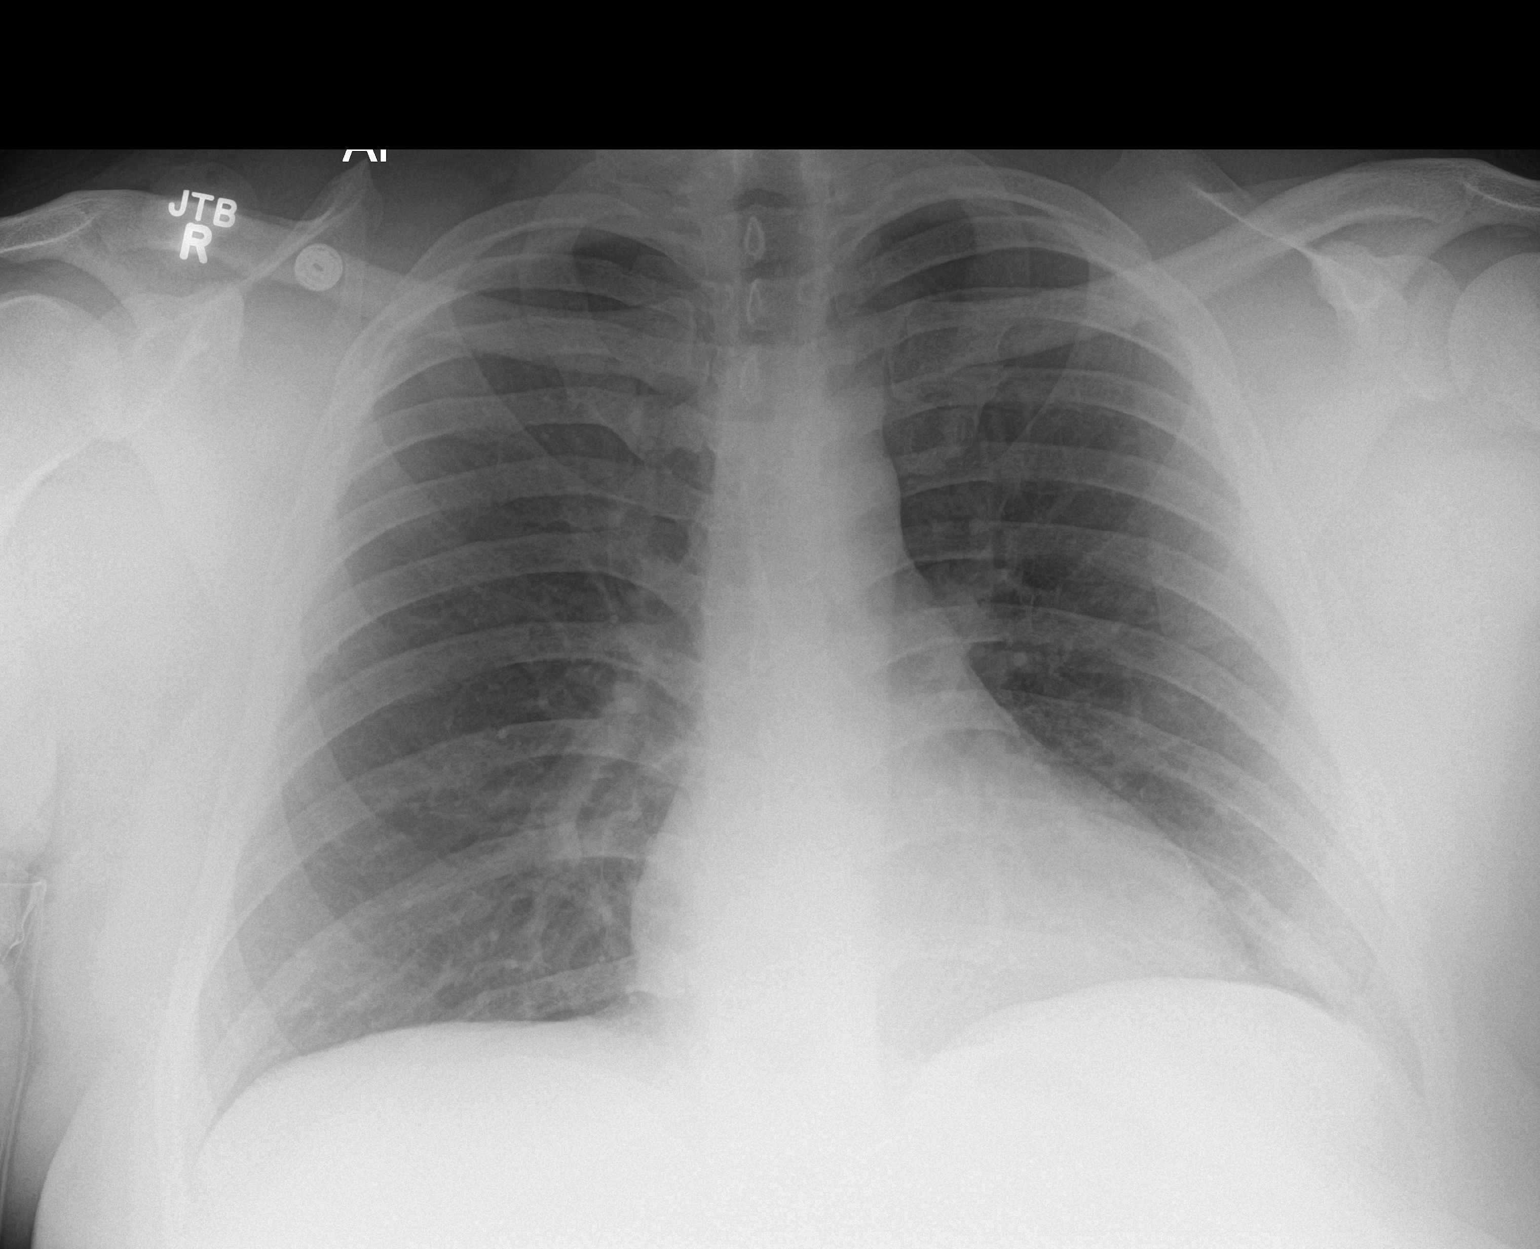

[2 of 2 positions shown; findings below may reference images not displayed]

FINDINGS: The heart size and mediastinal contours are within normal limits.
Both lungs are clear. No pneumothorax or pleural effusion is noted.
The visualized skeletal structures are unremarkable.
IMPRESSION: No active cardiopulmonary disease.

## 2016-06-20 ENCOUNTER — Emergency Department (HOSPITAL_COMMUNITY)
Admission: EM | Admit: 2016-06-20 | Discharge: 2016-06-20 | Disposition: A | Discharge status: Home / Self Care | Payer: PRIVATE HEALTH INSURANCE | Attending: Emergency Medicine | Admitting: Emergency Medicine

## 2016-06-20 ENCOUNTER — Encounter (HOSPITAL_COMMUNITY): Payer: Self-pay

## 2016-06-20 DIAGNOSIS — R319 Hematuria, unspecified: Secondary | ICD-10-CM | POA: Insufficient documentation

## 2016-06-20 LAB — I-STAT CHEM 8, ED
BUN: 13 mg/dL (ref 6–20)
CALCIUM ION: 1.1 mmol/L — AB (ref 1.15–1.40)
CREATININE: 0.9 mg/dL (ref 0.61–1.24)
Chloride: 103 mmol/L (ref 101–111)
GLUCOSE: 106 mg/dL — AB (ref 65–99)
HCT: 47 % (ref 39.0–52.0)
HEMOGLOBIN: 16 g/dL (ref 13.0–17.0)
Potassium: 3.8 mmol/L (ref 3.5–5.1)
Sodium: 141 mmol/L (ref 135–145)
TCO2: 27 mmol/L (ref 0–100)

## 2016-06-20 LAB — URINALYSIS, ROUTINE W REFLEX MICROSCOPIC
Bilirubin Urine: NEGATIVE
GLUCOSE, UA: NEGATIVE mg/dL
KETONES UR: NEGATIVE mg/dL
Leukocytes, UA: NEGATIVE
Nitrite: NEGATIVE
Protein, ur: NEGATIVE mg/dL
Specific Gravity, Urine: 1.023 (ref 1.005–1.030)
pH: 7 (ref 5.0–8.0)

## 2016-06-20 LAB — URINE MICROSCOPIC-ADD ON

## 2016-06-20 NOTE — ED Triage Notes (Signed)
Pt. Here for hematuria and bilateral lower back pain x1 month. Pt. sts he had a UA yesterday for a job and blood found. Pt. Denies dysuria.

## 2016-06-20 NOTE — Discharge Instructions (Signed)
As discussed, your evaluation today has been largely reassuring.  But, it is important that you monitor your condition carefully, and do not hesitate to return to the ED if you develop new, or concerning changes in your condition. ? ?Otherwise, please follow-up with your physician for appropriate ongoing care. ? ?

## 2016-06-20 NOTE — ED Provider Notes (Signed)
[facility]-EMERGENCY DEPT Provider Note   CSN: [id number] Arrival date & time: 06/20/16  1220     History   Chief Complaint Chief Complaint  Patient presents with  . Hematuria    HPI Daniel Mckay is a 30 y.o. male.  HPI  Patient presents with concerns of intermittent ongoing low back pain as well as hematuria. He states that he is generally well, though he has no recent doctor visits. He has no history of kidney stones per Yesterday, during a job evaluation, physical exam, he was on a hematuria. He discussed his ongoing intermittent back pain with that physician, and was told to follow-up further valuation. Today he has no fever, no chills, no nausea, no abdominal pain, and currently actually has no back pain. Hematuria is microscopic.  There is no dysuria, no asymmetry of his low back pain. Patient taking nothing for relief. 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 Patient has multiple family members with multiple illnesses including diabetes, kidney stone, hypertension.   Past Medical History:  Diagnosis Date  . Heart murmur     There are no active problems to display for this patient.   History reviewed. No pertinent surgical history.     Home Medications    Prior to Admission medications   Medication Sig Start Date End Date Taking? Authorizing Provider  naproxen (NAPROSYN) 500 MG tablet Take 1 tablet (500 mg total) by mouth 2 (two) times daily. Patient not taking: Reported on 02/17/2015 07/06/14   Rolland PorterMark James, MD    Family History History reviewed. No pertinent family history.  Social History Social History  Substance Use Topics  . Smoking status: Never Smoker  . Smokeless tobacco: Never Used  . Alcohol use Yes     Comment: occ     Allergies   Review of patient's allergies indicates no known allergies.   Review of Systems Review of Systems  Constitutional:       Per HPI, otherwise negative  HENT:       Per HPI, otherwise negative  Respiratory:   Per HPI, otherwise negative  Cardiovascular:       Per HPI, otherwise negative  Gastrointestinal: Negative for vomiting.  Endocrine:       Negative aside from HPI  Genitourinary:       Neg aside from HPI   Musculoskeletal:       Per HPI, otherwise negative  Skin: Negative.   Neurological: Negative for syncope.     Physical Exam Updated Vital Signs BP 160/94 (BP Location: Right Arm)   Pulse 86   Temp 97.5 F (36.4 C) (Oral)   Resp 18   Ht 5\' 9"  (1.753 m)   Wt (!) 308 lb (139.7 kg)   SpO2 97%   BMI 45.48 kg/m   Physical Exam  Constitutional: He is oriented to person, place, and time. He appears well-developed. No distress.  Large young male sitting upright speaking clearly  HENT:  Head: Normocephalic and atraumatic.  Eyes: Conjunctivae and EOM are normal.  Cardiovascular: Normal rate and regular rhythm.   Pulmonary/Chest: Effort normal. No stridor. No respiratory distress.  Abdominal: He exhibits no distension and no mass. There is no tenderness. There is no guarding.  Musculoskeletal: He exhibits no edema.  Neurological: He is alert and oriented to person, place, and time.  Skin: Skin is warm and dry.  Psychiatric: He has a normal mood and affect.  Nursing note and vitals reviewed.    ED Treatments / Results  Labs (all labs  ordered are listed, but only abnormal results are displayed) Labs Reviewed  URINALYSIS, ROUTINE W REFLEX MICROSCOPIC (NOT AT [facility]) - Abnormal; Notable for the following:       Result Value   Hgb urine dipstick SMALL (*)    All other components within normal limits  URINE MICROSCOPIC-ADD ON - Abnormal; Notable for the following:    Squamous Epithelial / LPF 0-5 (*)    Bacteria, UA FEW (*)    All other components within normal limits  I-STAT CHEM 8, ED - Abnormal; Notable for the following:    Glucose, Bld 106 (*)    Calcium, Ion 1.10 (*)    All other components within normal limits     Procedures Procedures (including critical care  time)   Initial Impression / Assessment and Plan / ED Course  I have reviewed the triage vital signs and the nursing notes.  Pertinent labs & imaging results that were available during my care of the patient were reviewed by me and considered in my medical decision making (see chart for details).  Clinical Course    2:03 PM Patient awake and alert, aware of all findings per With no evidence for renal dysfunction, and with only trace hematuria, patient is appropriate for further evaluation, management as an outpatient. Patient was understanding of need for outpatient follow-up.   Final Clinical Impressions(s) / ED Diagnoses  Hematuria    Gerhard Munchobert Natia Fahmy, MD 06/20/16 1404

## 2018-06-10 ENCOUNTER — Encounter (HOSPITAL_BASED_OUTPATIENT_CLINIC_OR_DEPARTMENT_OTHER): Payer: Self-pay | Admitting: Emergency Medicine

## 2018-06-10 ENCOUNTER — Emergency Department (HOSPITAL_BASED_OUTPATIENT_CLINIC_OR_DEPARTMENT_OTHER)
Admission: EM | Admit: 2018-06-10 | Discharge: 2018-06-10 | Disposition: A | Discharge status: Home / Self Care | Payer: PRIVATE HEALTH INSURANCE | Attending: Emergency Medicine | Admitting: Emergency Medicine

## 2018-06-10 ENCOUNTER — Other Ambulatory Visit: Payer: Self-pay

## 2018-06-10 DIAGNOSIS — M542 Cervicalgia: Secondary | ICD-10-CM | POA: Insufficient documentation

## 2018-06-10 MED ORDER — IBUPROFEN 600 MG PO TABS: 600.0000 mg | ORAL_TABLET | Freq: Four times a day (QID) | ORAL | 0 refills | Status: AC | PRN

## 2018-06-10 MED ORDER — CYCLOBENZAPRINE HCL 10 MG PO TABS: 10.0000 mg | ORAL_TABLET | Freq: Two times a day (BID) | ORAL | 0 refills | Status: AC | PRN
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
